# Patient Record
Sex: Male | Born: 1980 | Race: White | Hispanic: No | Marital: Single | Smoking: Never smoker
Health system: Southern US, Community
[De-identification: ages and names within clinical notes are randomized; demographics above are authoritative.]

## PROBLEM LIST (undated history)

## (undated) HISTORY — PX: FRACTURE SURGERY: SHX138

## (undated) HISTORY — PX: ANKLE SURGERY: SHX546

---
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
## 2003-05-04 ENCOUNTER — Emergency Department (HOSPITAL_COMMUNITY): Admission: EM | Admit: 2003-05-04 | Discharge: 2003-05-04 | Payer: Self-pay | Admitting: *Deleted

## 2003-08-15 ENCOUNTER — Emergency Department (HOSPITAL_COMMUNITY): Admission: AD | Admit: 2003-08-15 | Discharge: 2003-08-15 | Payer: Self-pay | Admitting: Family Medicine

## 2003-08-17 ENCOUNTER — Emergency Department (HOSPITAL_COMMUNITY): Admission: EM | Admit: 2003-08-17 | Discharge: 2003-08-18 | Payer: Self-pay | Admitting: Emergency Medicine

## 2004-07-21 ENCOUNTER — Emergency Department (HOSPITAL_COMMUNITY): Admission: EM | Admit: 2004-07-21 | Discharge: 2004-07-21 | Payer: Self-pay | Admitting: Family Medicine

## 2004-08-01 ENCOUNTER — Emergency Department (HOSPITAL_COMMUNITY): Admission: EM | Admit: 2004-08-01 | Discharge: 2004-08-01 | Payer: Self-pay | Admitting: Family Medicine

## 2004-08-03 ENCOUNTER — Emergency Department (HOSPITAL_COMMUNITY): Admission: EM | Admit: 2004-08-03 | Discharge: 2004-08-03 | Payer: Self-pay | Admitting: Family Medicine

## 2005-01-13 ENCOUNTER — Emergency Department (HOSPITAL_COMMUNITY): Admission: EM | Admit: 2005-01-13 | Discharge: 2005-01-13 | Payer: Self-pay | Admitting: Emergency Medicine

## 2005-02-01 ENCOUNTER — Emergency Department (HOSPITAL_COMMUNITY): Admission: EM | Admit: 2005-02-01 | Discharge: 2005-02-01 | Payer: Self-pay | Admitting: Emergency Medicine

## 2005-06-27 ENCOUNTER — Emergency Department (HOSPITAL_COMMUNITY): Admission: EM | Admit: 2005-06-27 | Discharge: 2005-06-27 | Payer: Self-pay | Admitting: Family Medicine

## 2005-10-10 ENCOUNTER — Ambulatory Visit: Payer: Self-pay | Admitting: Family Medicine

## 2005-11-14 ENCOUNTER — Ambulatory Visit: Payer: Self-pay | Admitting: Family Medicine

## 2006-05-24 ENCOUNTER — Emergency Department (HOSPITAL_COMMUNITY): Admission: EM | Admit: 2006-05-24 | Discharge: 2006-05-24 | Payer: Self-pay | Admitting: Family Medicine

## 2006-09-21 ENCOUNTER — Emergency Department (HOSPITAL_COMMUNITY): Admission: EM | Admit: 2006-09-21 | Discharge: 2006-09-21 | Payer: Self-pay | Admitting: Family Medicine

## 2007-11-05 ENCOUNTER — Ambulatory Visit: Payer: Self-pay | Admitting: Family Medicine

## 2007-11-05 ENCOUNTER — Encounter (INDEPENDENT_AMBULATORY_CARE_PROVIDER_SITE_OTHER): Payer: Self-pay | Admitting: Family Medicine

## 2007-11-05 DIAGNOSIS — R5381 Other malaise: Secondary | ICD-10-CM

## 2007-11-05 DIAGNOSIS — N509 Disorder of male genital organs, unspecified: Secondary | ICD-10-CM | POA: Insufficient documentation

## 2007-11-05 DIAGNOSIS — F411 Generalized anxiety disorder: Secondary | ICD-10-CM | POA: Insufficient documentation

## 2007-11-05 DIAGNOSIS — R5383 Other fatigue: Secondary | ICD-10-CM

## 2007-11-05 LAB — CONVERTED CEMR LAB
Chlamydia, Swab/Urine, PCR: NEGATIVE
GC Probe Amp, Urine: NEGATIVE
HCT: 50.2 % (ref 39.0–52.0)
Hemoglobin: 17.4 g/dL — ABNORMAL HIGH (ref 13.0–17.0)
MCHC: 34.7 g/dL (ref 30.0–36.0)
MCV: 87.5 fL (ref 78.0–100.0)
Platelets: 251 10*3/uL (ref 150–400)
RBC: 5.74 M/uL (ref 4.22–5.81)
RDW: 12.6 % (ref 11.5–15.5)
TSH: 1.15 microintl units/mL (ref 0.350–5.50)
WBC: 6.8 10*3/uL (ref 4.0–10.5)

## 2007-12-09 ENCOUNTER — Ambulatory Visit: Payer: Self-pay | Admitting: Family Medicine

## 2007-12-09 DIAGNOSIS — J321 Chronic frontal sinusitis: Secondary | ICD-10-CM

## 2007-12-24 ENCOUNTER — Ambulatory Visit: Payer: Self-pay | Admitting: Family Medicine

## 2007-12-24 DIAGNOSIS — J309 Allergic rhinitis, unspecified: Secondary | ICD-10-CM | POA: Insufficient documentation

## 2008-06-19 ENCOUNTER — Emergency Department (HOSPITAL_COMMUNITY): Admission: EM | Admit: 2008-06-19 | Discharge: 2008-06-19 | Payer: Self-pay | Admitting: Emergency Medicine

## 2009-06-08 ENCOUNTER — Emergency Department (HOSPITAL_COMMUNITY): Admission: EM | Admit: 2009-06-08 | Discharge: 2009-06-08 | Payer: Self-pay | Admitting: Family Medicine

## 2014-07-17 ENCOUNTER — Emergency Department (HOSPITAL_COMMUNITY): Payer: BC Managed Care – PPO

## 2014-07-17 ENCOUNTER — Encounter (HOSPITAL_COMMUNITY): Payer: Self-pay | Admitting: Emergency Medicine

## 2014-07-17 ENCOUNTER — Emergency Department (HOSPITAL_COMMUNITY)
Admission: EM | Admit: 2014-07-17 | Discharge: 2014-07-18 | Disposition: A | Discharge status: Home / Self Care | Payer: BC Managed Care – PPO | Attending: Emergency Medicine | Admitting: Emergency Medicine

## 2014-07-17 DIAGNOSIS — Y92410 Unspecified street and highway as the place of occurrence of the external cause: Secondary | ICD-10-CM | POA: Insufficient documentation

## 2014-07-17 DIAGNOSIS — S0184XA Puncture wound with foreign body of other part of head, initial encounter: Secondary | ICD-10-CM | POA: Diagnosis not present

## 2014-07-17 DIAGNOSIS — Z23 Encounter for immunization: Secondary | ICD-10-CM | POA: Diagnosis not present

## 2014-07-17 DIAGNOSIS — S0081XA Abrasion of other part of head, initial encounter: Secondary | ICD-10-CM

## 2014-07-17 DIAGNOSIS — M795 Residual foreign body in soft tissue: Secondary | ICD-10-CM

## 2014-07-17 DIAGNOSIS — S0990XA Unspecified injury of head, initial encounter: Secondary | ICD-10-CM | POA: Diagnosis present

## 2014-07-17 DIAGNOSIS — Y9389 Activity, other specified: Secondary | ICD-10-CM | POA: Diagnosis not present

## 2014-07-17 MED ORDER — TETANUS-DIPHTH-ACELL PERTUSSIS 5-2.5-18.5 LF-MCG/0.5 IM SUSP
0.5000 mL | Freq: Once | INTRAMUSCULAR | Status: AC
  Administered 2014-07-17: 0.5 mL via INTRAMUSCULAR
  Filled 2014-07-17: qty 0.5

## 2014-07-17 MED ORDER — ACETAMINOPHEN 500 MG PO TABS
1000.0000 mg | ORAL_TABLET | Freq: Once | ORAL | Status: AC
  Administered 2014-07-17: 1000 mg via ORAL
  Filled 2014-07-17: qty 2
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
## 2014-07-18 NOTE — ED Notes (Signed)
MD at bedside. 

## 2015-01-20 ENCOUNTER — Emergency Department (HOSPITAL_COMMUNITY): Payer: BLUE CROSS/BLUE SHIELD

## 2015-01-20 ENCOUNTER — Encounter (HOSPITAL_COMMUNITY): Payer: Self-pay | Admitting: *Deleted

## 2015-01-20 ENCOUNTER — Emergency Department (HOSPITAL_COMMUNITY)
Admission: EM | Admit: 2015-01-20 | Discharge: 2015-01-21 | Disposition: A | Discharge status: Home / Self Care | Payer: BLUE CROSS/BLUE SHIELD | Attending: Emergency Medicine | Admitting: Emergency Medicine

## 2015-01-20 DIAGNOSIS — Y998 Other external cause status: Secondary | ICD-10-CM | POA: Insufficient documentation

## 2015-01-20 DIAGNOSIS — Y9289 Other specified places as the place of occurrence of the external cause: Secondary | ICD-10-CM | POA: Diagnosis not present

## 2015-01-20 DIAGNOSIS — S6991XA Unspecified injury of right wrist, hand and finger(s), initial encounter: Secondary | ICD-10-CM | POA: Diagnosis present

## 2015-01-20 DIAGNOSIS — S63259A Unspecified dislocation of unspecified finger, initial encounter: Secondary | ICD-10-CM

## 2015-01-20 DIAGNOSIS — S63294A Dislocation of distal interphalangeal joint of right ring finger, initial encounter: Secondary | ICD-10-CM | POA: Insufficient documentation

## 2015-01-20 DIAGNOSIS — W2107XA Struck by softball, initial encounter: Secondary | ICD-10-CM | POA: Insufficient documentation

## 2015-01-20 DIAGNOSIS — Y9364 Activity, baseball: Secondary | ICD-10-CM | POA: Diagnosis not present

## 2015-01-20 MED ORDER — LIDOCAINE HCL (PF) 1 % IJ SOLN
5.0000 mL | Freq: Once | INTRAMUSCULAR | Status: AC
  Administered 2015-01-20: 5 mL via INTRADERMAL
  Filled 2015-01-20: qty 5

## 2015-01-20 NOTE — ED Provider Notes (Signed)
CSN: [id number]     Arrival date & time 01/20/15  2301 History  This chart was scribed for non-physician provider Harle BattiestElizabeth Antwan Pandya, NP-C, working with Marisa Severinlga Otter, MD by Phillis HaggisGabriella Gaje, ED Scribe. This patient was seen in room WTR8/WTR8 and patient care was started at 11:30 PM.     Chief Complaint  Patient presents with  . Finger Injury   The history is provided by the patient. No language interpreter was used.  HPI Comments: Sergio Humphrey is a 34 y.o. male who presents to the Emergency Department complaining of right ring finger injury onset PTA. He states that he was playing softball when his ring finger was hit with the ball. He states that he is not in intense pain right now, rates it 3/10. Patient reports taking ibuprofen PTA.    History reviewed. No pertinent past medical history. Past Surgical History  Procedure Laterality Date  . Fracture surgery      bilateral arms   No family history on file. History  Substance Use Topics  . Smoking status: Never Smoker   . Smokeless tobacco: Not on file  . Alcohol Use: Yes     Comment: occ    Review of Systems  Musculoskeletal: Positive for joint swelling and arthralgias.  Neurological: Negative for weakness and numbness.   Allergies  Codeine  Home Medications   Prior to Admission medications   Not on File   BP 115/68 mmHg  Pulse 103  Temp(Src) 98.9 F (37.2 C) (Oral)  Resp 16  SpO2 100%  Physical Exam  Constitutional: He is oriented to person, place, and time. He appears well-developed and well-nourished. No distress.  HENT:  Head: Normocephalic and atraumatic.  Eyes: Conjunctivae and EOM are normal.  Neck: Normal range of motion. Neck supple.  Cardiovascular: Normal rate, regular rhythm and normal heart sounds.   Pulmonary/Chest: Effort normal and breath sounds normal.  Musculoskeletal: Normal range of motion. He exhibits no edema.  5/5 strength and flexion in all fingers except 4th finger. Deformity noted to  the 4th finger over DIP joint. Mild ecchymosis to the second phalanx.    Neurological: He is alert and oriented to person, place, and time.  Skin: Skin is warm and dry.  Psychiatric: He has a normal mood and affect. His behavior is normal.  Nursing note and vitals reviewed.   ED Course  Procedures (including critical care time) NERVE BLOCK Performed by: Harle Battiestysinger, [place] Hill[place] Consent: Verbal consent obtained. Required items: required blood products, implants, devices, and special equipment available Time out: Immediately prior to procedure a "time out" was called to verify the correct patient, procedure, equipment, support staff and site/side marked as required.  Indication: finger dislocation Nerve block body site: right fourth finger digital block  Preparation: Patient was prepped and draped in the usual sterile fashion. Needle gauge: 27 G Location technique: anatomical landmarks  Local anesthetic: lidocaine without epi  Anesthetic total: 4 ml  Outcome: pain improved Patient tolerance: Patient tolerated the procedure well with no immediate complications.   DIAGNOSTIC STUDIES: Oxygen Saturation is 100% on room air, normal by my interpretation.    COORDINATION OF CARE: 11:32 PM-Discussed treatment plan which includes x-ray with pt at bedside and pt agreed to plan.   Labs Review Labs Reviewed - No data to display  Imaging Review Dg Finger Ring Right  01/20/2015   CLINICAL DATA:  Softball injury.  EXAM: RIGHT RING FINGER 2+V  COMPARISON:  None.  FINDINGS: There is dorsal dislocation about the fourth  DIP. No fracture is evident. There is no radiopaque foreign body.  IMPRESSION: Dorsal dislocation of the fourth DIP.   Electronically Signed   By: Ellery Plunkaniel R Mitchell M.D.   On: 01/20/2015 23:38     EKG Interpretation None      MDM   Final diagnoses:  Finger dislocation, initial encounter   34 yo with dislocation of right ring finger dislocation from softball injury, no  fracture noted on x-ray.  Digital block done and finger reduced without difficulty. Splint provided. Finger remain n/v intact throughout. Pt is well-appearing, in no acute distress and vital signs reviewed and not concerning. He appears safe to be discharged.  Discharge include follow-up with hand surgery.  Return precautions provided.  Pt aware of plan and in agreement.  Filed Vitals:   01/20/15 2316  BP: 115/68  Pulse: 103  Temp: 98.9 F (37.2 C)  TempSrc: Oral  Resp: 16  SpO2: 100%   Meds given in ED:  Medications  lidocaine (PF) (XYLOCAINE) 1 % injection 5 mL (5 mLs Intradermal Given by Other 01/20/15 2357)    Discharge Medication List as of 01/21/2015 12:03 AM    START taking these medications   Details  HYDROcodone-acetaminophen (NORCO/VICODIN) 5-325 MG per tablet Take 1 tablet by mouth every 4 (four) hours as needed., Starting 01/21/2015, Until Discontinued, Print    naproxen (NAPROSYN) 500 MG tablet Take 1 tablet (500 mg total) by mouth 2 (two) times daily., Starting 01/21/2015, Until Discontinued, Print          Harle BattiestElizabeth Chea Malan, NP 01/22/15 [id number]  Marisa Severinlga Otter, MD 01/22/15 [phone]

## 2015-01-20 NOTE — ED Notes (Signed)
See triage note.

## 2015-01-20 NOTE — ED Notes (Signed)
Pt reports getting hit in his R ring finger while playing softball today.  No obvious deformity noted at this time.

## 2015-01-21 MED ORDER — HYDROCODONE-ACETAMINOPHEN 5-325 MG PO TABS: 1.0000 | ORAL_TABLET | ORAL | Status: DC | PRN

## 2015-01-21 MED ORDER — NAPROXEN 500 MG PO TABS: 500.0000 mg | ORAL_TABLET | Freq: Two times a day (BID) | ORAL | Status: DC

## 2015-01-21 NOTE — Discharge Instructions (Signed)
Please follow the directions provided. Be sure to follow up with the hand doctor. Wear your splint for comfort. Take naproxen twice a day to help with inflammation. You may take Vicodin for pain not relieved by the naproxen. Don't hesitate to return for any new, worsening, or concerning symptoms.   SEEK IMMEDIATE MEDICAL CARE IF:  Your dressing or splint becomes damaged.  Your pain becomes worse rather than better.  You lose feeling in your finger, or it becomes cold and white.
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
## 2016-08-11 ENCOUNTER — Encounter (HOSPITAL_COMMUNITY): Payer: Self-pay | Admitting: Emergency Medicine

## 2016-08-11 ENCOUNTER — Ambulatory Visit (HOSPITAL_COMMUNITY)
Admission: EM | Admit: 2016-08-11 | Discharge: 2016-08-11 | Disposition: A | Discharge status: Home / Self Care | Payer: BLUE CROSS/BLUE SHIELD | Attending: Emergency Medicine | Admitting: Emergency Medicine

## 2016-08-11 DIAGNOSIS — T700XXA Otitic barotrauma, initial encounter: Secondary | ICD-10-CM | POA: Diagnosis not present

## 2016-08-11 DIAGNOSIS — H6983 Other specified disorders of Eustachian tube, bilateral: Secondary | ICD-10-CM | POA: Diagnosis not present

## 2016-08-11 DIAGNOSIS — J069 Acute upper respiratory infection, unspecified: Secondary | ICD-10-CM

## 2016-08-11 NOTE — ED Triage Notes (Signed)
Here for cold sx onset 1 week onset associated w/nasal congestion/drainage, prod cough and bilateral ear pain  Denies fever, chills  A&O x4... NAD

## 2016-08-11 NOTE — ED Provider Notes (Signed)
CSN: [id number]     Arrival date & time 08/11/16  1823 History   First MD Initiated Contact with Patient 08/11/16 1834     Chief Complaint  Patient presents with  . URI   (Consider location/radiation/quality/duration/timing/severity/associated sxs/prior Treatment) 35 year old male complaining of upper respiratory congestion, nasal congestion, some sinus pressure, pressure in the ears and runny nose for about a week. He has taken Mucinex without substantial relief. Denies fevers or chills.      History reviewed. No pertinent past medical history. Past Surgical History:  Procedure Laterality Date  . FRACTURE SURGERY     bilateral arms   No family history on file. Social History  Substance Use Topics  . Smoking status: Never Smoker  . Smokeless tobacco: Never Used  . Alcohol use Yes     Comment: occ    Review of Systems  Constitutional: Positive for activity change and fever. Negative for diaphoresis and fatigue.  HENT: Positive for congestion, postnasal drip and rhinorrhea. Negative for ear pain, facial swelling, sore throat and trouble swallowing.   Eyes: Negative for pain, discharge and redness.  Respiratory: Negative for cough, chest tightness and shortness of breath.   Cardiovascular: Negative.   Gastrointestinal: Negative.   Musculoskeletal: Negative.  Negative for neck pain and neck stiffness.  Neurological: Negative.     Allergies  Codeine  Home Medications   Prior to Admission medications   Medication Sig Start Date End Date Taking? Authorizing Provider  HYDROcodone-acetaminophen (NORCO/VICODIN) 5-325 MG per tablet Take 1 tablet by mouth every 4 (four) hours as needed. 01/21/15   Harle BattiestElizabeth Tysinger, NP  naproxen (NAPROSYN) 500 MG tablet Take 1 tablet (500 mg total) by mouth 2 (two) times daily. 01/21/15   Harle BattiestElizabeth Tysinger, NP   Meds Ordered and Administered this Visit  Medications - No data to display  BP 119/83 (BP Location: Right Arm)   Pulse 78   Temp  98.3 F (36.8 C) (Oral)   Resp 18   SpO2 96%  No data found.   Physical Exam  Constitutional: He is oriented to person, place, and time. He appears well-developed and well-nourished. No distress.  HENT:  Head: Normocephalic and atraumatic.  Right Ear: External ear normal.  Bilateral TMs with minor retraction otherwise normal. Oropharynx with minor cobblestoning, scant clear PND. No exudates or swelling.  Eyes: EOM are normal.  Neck: Normal range of motion. Neck supple.  Cardiovascular: Normal rate, regular rhythm and normal heart sounds.   Pulmonary/Chest: Effort normal and breath sounds normal.  Musculoskeletal: Normal range of motion.  Lymphadenopathy:    He has no cervical adenopathy.  Neurological: He is alert and oriented to person, place, and time.  Skin: Skin is warm and dry.  Nursing note and vitals reviewed.   Urgent Care Course   Clinical Course     Procedures (including critical care time)  Labs Review Labs Reviewed - No data to display  Imaging Review No results found.   Visual Acuity Review  Right Eye Distance:   Left Eye Distance:   Bilateral Distance:    Right Eye Near:   Left Eye Near:    Bilateral Near:         MDM   1. Acute upper respiratory infection   2. ETD (Eustachian tube dysfunction), bilateral   3. Barotitis media, initial encounter   Sudafed PE 10 mg every 4 to 6 hours as needed for congestion Allegra or Zyrtec daily as needed for drainage and runny nose. For stronger antihistamine  may take Chlor-Trimeton 2 to 4 mg every 4 to 6 hours, may cause drowsiness. Saline nasal spray used frequently. Ibuprofen 600 mg every 6 hours as needed for pain, discomfort or fever. Drink plenty of fluids and stay well-hydrated. Flonase or Rhinocort nasal spray daily      Hayden Rasmussenavid Gemini Bunte, NP 08/11/16 1906

## 2016-08-11 NOTE — Discharge Instructions (Signed)
Sudafed PE 10 mg every 4 to 6 hours as needed for congestion °Allegra or Zyrtec daily as needed for drainage and runny nose. °For stronger antihistamine may take Chlor-Trimeton 2 to 4 mg every 4 to 6 hours, may cause drowsiness. °Saline nasal spray used frequently. °Ibuprofen 600 mg every 6 hours as needed for pain, discomfort or fever. °Drink plenty of fluids and stay well-hydrated. °Flonase or Rhinocort nasal spray daily °

## 2016-08-15 ENCOUNTER — Encounter (HOSPITAL_COMMUNITY): Payer: Self-pay | Admitting: Emergency Medicine

## 2016-08-15 ENCOUNTER — Ambulatory Visit (HOSPITAL_COMMUNITY)
Admission: EM | Admit: 2016-08-15 | Discharge: 2016-08-15 | Disposition: A | Discharge status: Home / Self Care | Payer: BLUE CROSS/BLUE SHIELD | Attending: Emergency Medicine | Admitting: Emergency Medicine

## 2016-08-15 DIAGNOSIS — J0110 Acute frontal sinusitis, unspecified: Secondary | ICD-10-CM | POA: Diagnosis not present

## 2016-08-15 MED ORDER — AMOXICILLIN-POT CLAVULANATE 875-125 MG PO TABS: 1.0000 | ORAL_TABLET | Freq: Two times a day (BID) | ORAL | 0 refills | Status: DC

## 2016-08-15 NOTE — ED Provider Notes (Signed)
[facility]    CSN: [id number] Arrival date & [id number]: 08/15/16  1643     History   Chief Complaint Chief Complaint  Patient presents with  . Facial Pain    HPI Sergio Humphrey is a 35 y.o. male.   HPI  He is a 35 year old man here for evaluation of sinus pressure. He states he started with a cold about 2 weeks ago. He was seen here for 5 days ago and diagnosed with a cold. He was given symptomatic treatment. He states most symptoms have resolved, but he has had worsening sinus pressure and yellow nasal discharge. He does still have a cough, but this is improved. No fevers.  History reviewed. No pertinent past medical history.  Patient Active Problem List   Diagnosis Date Noted  . ALLERGIC RHINITIS 12/24/2007  . FRONTAL SINUSITIS 12/09/2007  . ANXIETY STATE, UNSPECIFIED 11/05/2007  . TESTICULAR PAIN, RIGHT 11/05/2007  . FATIGUE 11/05/2007    Past Surgical History:  Procedure Laterality Date  . FRACTURE SURGERY     bilateral arms       Home Medications    Prior to Admission medications   Medication Sig Start Date End Date Taking? Authorizing Provider  amoxicillin-clavulanate (AUGMENTIN) 875-125 MG tablet Take 1 tablet by mouth 2 (two) times daily. 08/15/16   Charm RingsErin J Breslyn Abdo, MD    Family History History reviewed. No pertinent family history.  Social History Social History  Substance Use Topics  . Smoking status: Never Smoker  . Smokeless tobacco: Never Used  . Alcohol use Yes     Comment: occ     Allergies   Codeine   Review of Systems Review of Systems As in history of present illness  Physical Exam Triage Vital Signs ED Triage Vitals  Enc Vitals Group     BP 08/15/16 1656 114/76     Pulse Rate 08/15/16 1656 72     Resp 08/15/16 1656 16     Temp 08/15/16 1656 98.1 F (36.7 C)     Temp Source 08/15/16 1656 Oral     SpO2 08/15/16 1656 98 %     Weight --      Height --      Head Circumference --      Peak Flow --      Pain  Score 08/15/16 1700 6     Pain Loc --      Pain Edu? --      Excl. in GC? --    No data found.   Updated Vital Signs BP 114/76 (BP Location: Right Arm)   Pulse 72   Temp 98.1 F (36.7 C) (Oral)   Resp 16   SpO2 98%   Visual Acuity Right Eye Distance:   Left Eye Distance:   Bilateral Distance:    Right Eye Near:   Left Eye Near:    Bilateral Near:     Physical Exam  Constitutional: He is oriented to person, place, and time. He appears well-developed and well-nourished. No distress.  HENT:  Mouth/Throat: Oropharynx is clear and moist. No oropharyngeal exudate.  Nasal mucosa is edematous. Bilateral frontal sinus tenderness. No maxillary sinus tenderness.  Neck: Neck supple.  Cardiovascular: Normal rate, regular rhythm and normal heart sounds.   No murmur heard. Pulmonary/Chest: Effort normal and breath sounds normal. No respiratory distress. He has no wheezes. He has no rales.  Lymphadenopathy:    He has no cervical adenopathy.  Neurological: He is alert and oriented to person,  place, and time.     UC Treatments / Results  Labs (all labs ordered are listed, but only abnormal results are displayed) Labs Reviewed - No data to display  EKG  EKG Interpretation None       Radiology No results found.  Procedures Procedures (including critical care time)  Medications Ordered in UC Medications - No data to display   Initial Impression / Assessment and Plan / UC Course  I have reviewed the triage vital signs and the nursing notes.  Pertinent labs & imaging results that were available during my care of the patient were reviewed by me and considered in my medical decision making (see chart for details).  Clinical Course     Treat for sinusitis with Augmentin. Recommended frequent use of nasal saline spray. Follow-up as needed.  I have verbally reviewed the discharge instructions with the patient. A printed AVS was given to the patient.  All questions were  answered prior to discharge.    Final Clinical Impressions(s) / UC Diagnoses   Final diagnoses:  Acute non-recurrent frontal sinusitis    New Prescriptions New Prescriptions   AMOXICILLIN-CLAVULANATE (AUGMENTIN) 875-125 MG TABLET    Take 1 tablet by mouth 2 (two) times daily.     Charm RingsErin J Karne Ozga, MD 08/15/16 1723

## 2016-08-15 NOTE — Discharge Instructions (Signed)
You have a sinus infection. Take Augmentin twice a day for 10 days. Use nasal saline spray as often as you can to wash out the sinuses. Follow-up as needed.

## 2016-08-15 NOTE — ED Triage Notes (Signed)
[facility] a [facility] pressure  That started 4 days ago. The patient also reported a dark yellow phlegm.

## 2016-08-25 IMAGING — CR DG FINGER RING 2+V*R*
3 series · 3 of 3 positions shown · non-contrast
Comparison: None.

CLINICAL DATA: Softball injury.

EXAM:
RIGHT RING FINGER 2+V

[x finger pa right]
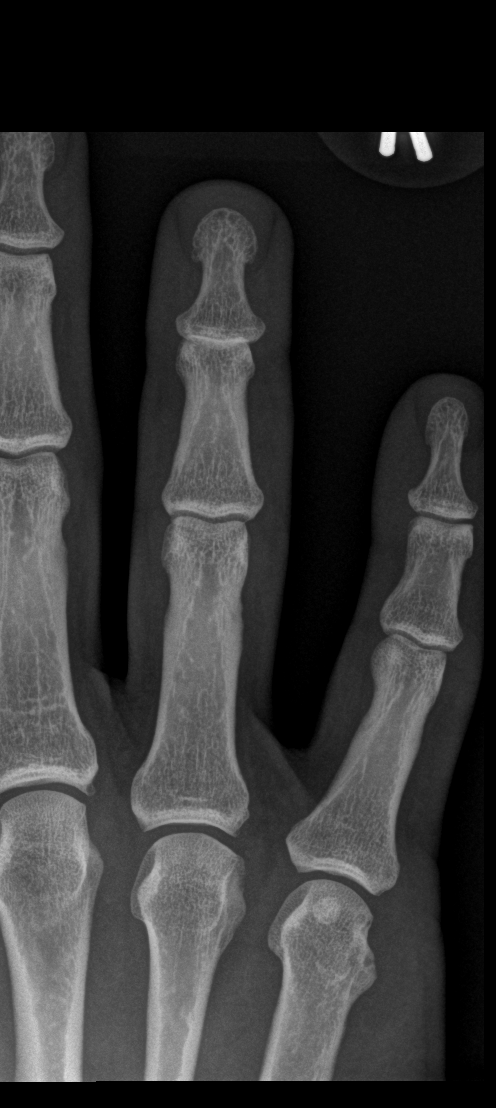

[x finger obl right]
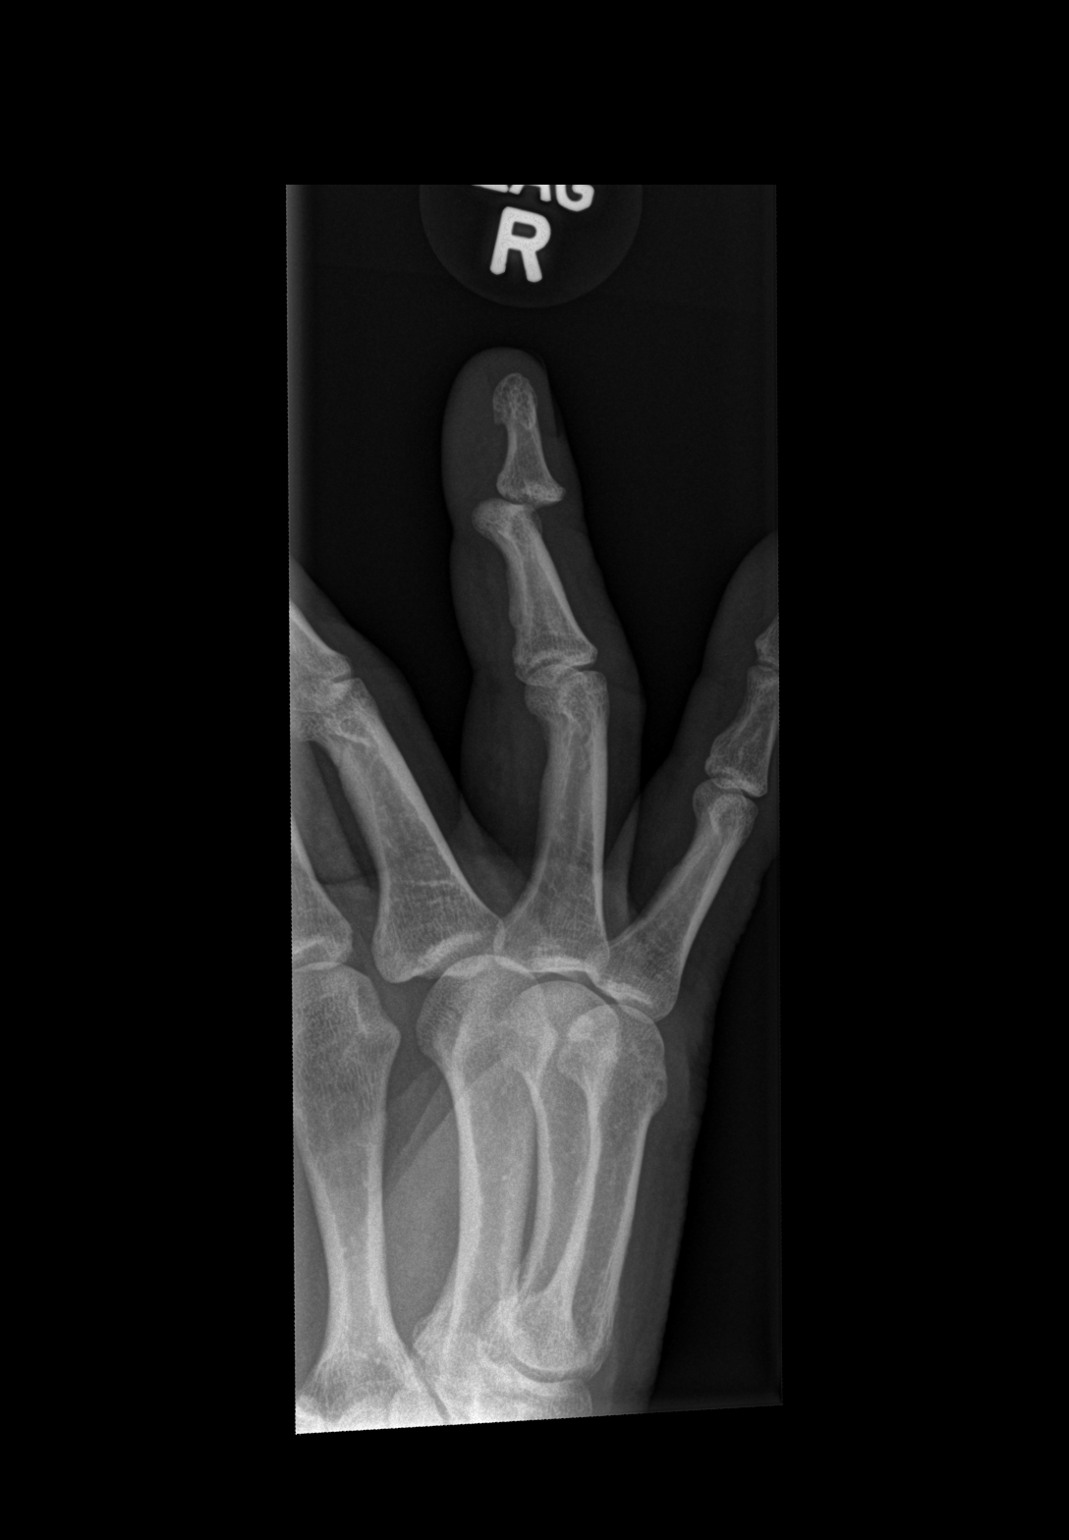

[x finger lat right]
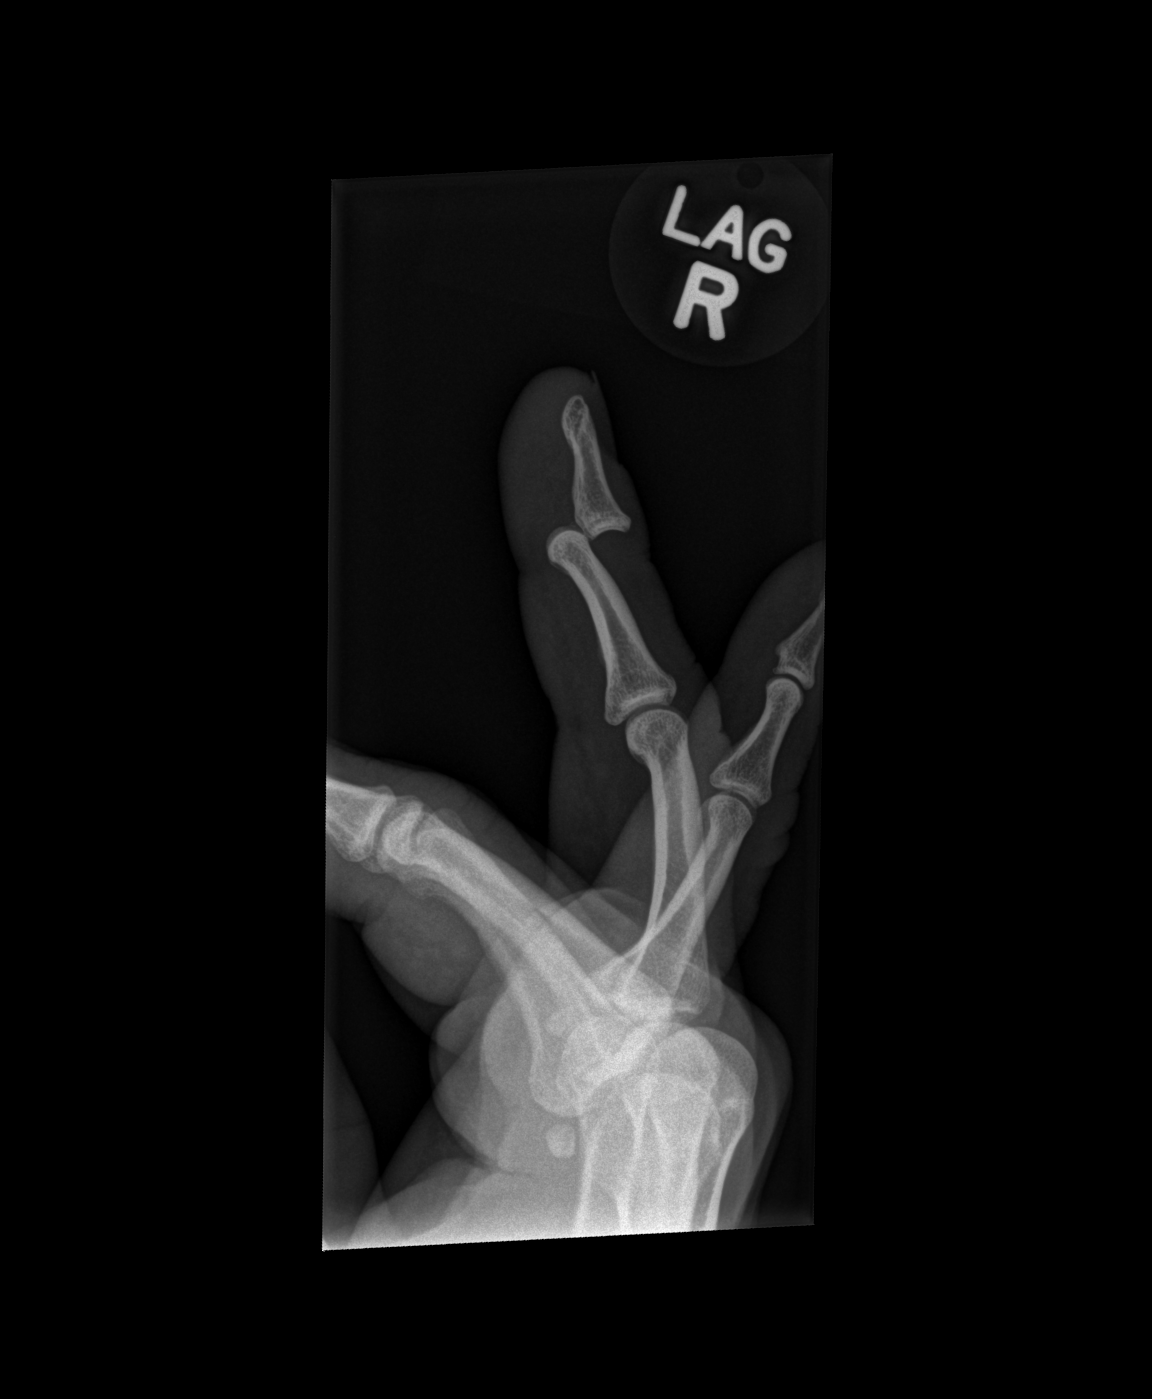

[3 of 3 positions shown; findings below may reference images not displayed]

FINDINGS: There is dorsal dislocation about the fourth DIP. No fracture is
evident. There is no radiopaque foreign body.
IMPRESSION: Dorsal dislocation of the fourth DIP.

## 2018-04-22 ENCOUNTER — Ambulatory Visit: Payer: Managed Care, Other (non HMO) | Admitting: Neurology

## 2018-04-22 ENCOUNTER — Encounter: Payer: Self-pay | Admitting: Neurology

## 2018-04-22 VITALS — BP 113/74 | HR 61 | Ht 71.0 in | Wt 179.0 lb

## 2018-04-22 DIAGNOSIS — R5381 Other malaise: Secondary | ICD-10-CM | POA: Diagnosis not present

## 2018-04-22 DIAGNOSIS — R5383 Other fatigue: Secondary | ICD-10-CM

## 2018-04-22 NOTE — Progress Notes (Signed)
Reason for visit: Malaise, head pressure  Referring physician: Dr. Gerarda FractionNewman  Sergio Humphrey is a 37 y.o. male  History of present illness:  Sergio Humphrey is a 37 year old right-handed white male with a 5441-month history of malaise.  The patient has started getting some pressure sensations in the temple area and in the ears, he may feel hot or cold, he is not actually running fevers.  He may have night sweats, he has had some mild weight loss over the last several weeks of up to 5 pounds.  The patient has some light sensitivity, he reports no nausea or vomiting, he has slight decrease in appetite.  He denies any numbness or focal weakness of the extremities.  He denies issues with balance or difficulty controlling the bowels or bladder.  He may feel dizzy on occasion.  The patient has just started Allegra, he was on a 6-day course of prednisone without benefit.  He denies any skin rashes or significant problems with joint pain.  He is concerned that he just does not feel well and it is not getting better.  He denies any tick exposure but he does work outside, he works for [employer], he does have exposure to some chemicals.  The patient had an ENT evaluation, he is to start Allegra for possible allergies in the near future.  He will be seeing his doctor tomorrow for blood work.  He comes here for further evaluation.  No past medical history on file.  Past Surgical History:  Procedure Laterality Date  . ANKLE SURGERY    . FRACTURE SURGERY     bilateral arms    Family History  Problem Relation Age of Onset  . Cancer Mother        breast    Social history:  reports that he has never smoked. He has never used smokeless tobacco. He reports that he drinks alcohol. He reports that he does not use drugs.  Medications:  Prior to Admission medications   Medication Sig Start Date End Date Taking? Authorizing Provider  amoxicillin-clavulanate (AUGMENTIN) 875-125 MG tablet Take 1 tablet by  mouth 2 (two) times daily. 04/22/18 05/02/18 Yes [provider]  fexofenadine (ALLEGRA) 180 MG tablet Take 1 tablet by mouth daily. 04/22/18  Yes [provider]  fluticasone (FLONASE) 50 MCG/ACT nasal spray Place 1 spray into both nostrils as needed. 04/09/18 04/09/19 Yes [provider]      Allergies  Allergen Reactions  . Codeine     ROS:  Out of a complete 14 system review of symptoms, the patient complains only of the following symptoms, and all other reviewed systems are negative.  Weight loss, fatigue Feeling hot, cold Ringing in the ears Headache Not enough sleep, decreased energy, change in appetite  Blood pressure 113/74, pulse 61, height 5\' 11"  (1.803 m), weight 179 lb (81.2 kg).  Physical Exam  General: The patient is alert and cooperative at the time of the examination.  Eyes: Pupils are equal, round, and reactive to light. Discs are flat bilaterally.  Neck: The neck is supple, no carotid bruits are noted.  Respiratory: The respiratory examination is clear.  Cardiovascular: The cardiovascular examination reveals a regular rate and rhythm, no obvious murmurs or rubs are noted.  Skin: Extremities are without significant edema.  Neurologic Exam  Mental status: The patient is alert and oriented x 3 at the time of the examination. The patient has apparent normal recent and remote memory, with an apparently normal  attention span and concentration ability.  Cranial nerves: Facial symmetry is present. There is good sensation of the face to pinprick and soft touch bilaterally. The strength of the facial muscles and the muscles to head turning and shoulder shrug are normal bilaterally. Speech is well enunciated, no aphasia or dysarthria is noted. Extraocular movements are full. Visual fields are full. The tongue is midline, and the patient has symmetric elevation of the soft palate. No obvious hearing deficits are noted.  Motor: The motor testing  reveals 5 over 5 strength of all 4 extremities. Good symmetric motor tone is noted throughout.  Sensory: Sensory testing is intact to pinprick, soft touch, vibration sensation, and position sense on all 4 extremities. No evidence of extinction is noted.  Coordination: Cerebellar testing reveals good finger-nose-finger and heel-to-shin bilaterally.  Gait and station: Gait is normal. Tandem gait is normal. Romberg is negative. No drift is seen.  Reflexes: Deep tendon reflexes are symmetric and normal bilaterally. Toes are downgoing bilaterally.   Assessment/Plan:  1.  Malaise  2.  Head pressure  The patient has a nonspecific syndrome of malaise and some pressure in the head.  The patient does not have a history consistent with migraine, first evaluation should include blood work, we will check blood work today.  He will be getting some routine blood work done through his primary care physician tomorrow.  If the course of Allegra does not help within the next 2 or 3 weeks, we may go on to get a CT of the head to evaluate the sinuses.  The patient will follow-up in 3 months.  Marlan Palau MD 04/22/2018 3:35 PM  [facility] [address] [place], [place] [address]  Phone [phone] Fax [phone]

## 2018-04-22 NOTE — Patient Instructions (Signed)
   We will check blood work today, call in 2 to 3 weeks if you are not feeling better on the Allegra.

## 2018-04-23 LAB — B. BURGDORFI ANTIBODIES

## 2018-04-23 LAB — RHEUMATOID FACTOR

## 2018-04-23 LAB — SEDIMENTATION RATE: SED RATE: 2 mm/h (ref 0–15)

## 2018-04-23 LAB — HIV ANTIBODY (ROUTINE TESTING W REFLEX): HIV Screen 4th Generation wRfx: NONREACTIVE

## 2018-04-23 LAB — ANA W/REFLEX: ANA: NEGATIVE

## 2018-04-24 ENCOUNTER — Telehealth: Payer: Self-pay | Admitting: *Deleted

## 2018-04-24 NOTE — Telephone Encounter (Signed)
Called and spoke with patient about unremarkable labs per CW,MD note. Pt verbalized understanding.  

## 2018-04-24 NOTE — Telephone Encounter (Signed)
-----   Message from York Spanielharles K Willis, MD sent at 04/23/2018  5:07 PM EDT -----  The blood work results are unremarkable. Please call the patient.  ----- Message ----- From: Nell RangeInterface, [facility] Lab Results In Sent: 04/23/2018   7:39 AM To: York Spanielharles K Willis, MD

## 2018-07-23 ENCOUNTER — Ambulatory Visit: Payer: Managed Care, Other (non HMO) | Admitting: Neurology

## 2021-05-30 ENCOUNTER — Ambulatory Visit (INDEPENDENT_AMBULATORY_CARE_PROVIDER_SITE_OTHER): Payer: BLUE CROSS/BLUE SHIELD | Admitting: Otolaryngology

## 2023-07-18 ENCOUNTER — Encounter: Payer: Self-pay | Admitting: Emergency Medicine

## 2023-07-18 ENCOUNTER — Ambulatory Visit
Admission: EM | Admit: 2023-07-18 | Discharge: 2023-07-18 | Disposition: A | Discharge status: Home / Self Care | Payer: PRIVATE HEALTH INSURANCE | Attending: Family Medicine | Admitting: Family Medicine

## 2023-07-18 ENCOUNTER — Ambulatory Visit: Payer: PRIVATE HEALTH INSURANCE

## 2023-07-18 DIAGNOSIS — J209 Acute bronchitis, unspecified: Secondary | ICD-10-CM | POA: Diagnosis not present

## 2023-07-18 DIAGNOSIS — R0981 Nasal congestion: Secondary | ICD-10-CM

## 2023-07-18 DIAGNOSIS — J22 Unspecified acute lower respiratory infection: Secondary | ICD-10-CM | POA: Diagnosis not present

## 2023-07-18 DIAGNOSIS — R059 Cough, unspecified: Secondary | ICD-10-CM

## 2023-07-18 MED ORDER — AMOXICILLIN-POT CLAVULANATE 875-125 MG PO TABS: 1.0000 | ORAL_TABLET | Freq: Two times a day (BID) | ORAL | 0 refills | Status: AC

## 2023-07-18 MED ORDER — PREDNISONE 20 MG PO TABS: 40.0000 mg | ORAL_TABLET | Freq: Every day | ORAL | 0 refills | Status: AC

## 2023-07-18 NOTE — ED Triage Notes (Signed)
Patient c/o cough, congestion, nasal drainage, body aches, lethargic, headaches x 1 week.  Patient is concerned for pneumonia.  His kids were recently diagnosed with pneumonia.  Patient has taken Mucinex, Delsym and Ibuprofen.

## 2023-07-18 NOTE — Discharge Instructions (Addendum)
Drink lots of fluids Take antibiotic as directed 2 times a day Take prednisone daily.  This will help with the coughing spells and congestion Continue Mucinex DM or similar for cough See your doctor if not improving by next week

## 2023-07-18 NOTE — ED Provider Notes (Signed)
[facility]    CSN: [id number] Arrival date & time: 07/18/23  1358      History   Chief Complaint Chief Complaint  Patient presents with   Cough    HPI [name] is a 42 y.o. male.   Patient states that his 2 children both have had coughs and infection for over a week.  They went to the pediatrician yesterday and they both were found to have pneumonia.  Patient states that he has also had a cough that is harsh, coughing spells, fatigue for almost 2 weeks.  He states that he has pain in his right chest with deep breath and with cough.  Minimal sputum production.  No shortness of breath or wheezing.    History reviewed. No pertinent past medical history.  Patient Active Problem List   Diagnosis Date Noted   ALLERGIC RHINITIS 12/24/2007   FRONTAL SINUSITIS 12/09/2007   ANXIETY STATE, UNSPECIFIED 11/05/2007   TESTICULAR PAIN, RIGHT 11/05/2007   FATIGUE 11/05/2007    Past Surgical History:  Procedure Laterality Date   ANKLE SURGERY     FRACTURE SURGERY     bilateral arms       Home Medications    Prior to Admission medications   Medication Sig Start Date End Date Taking? Authorizing Provider  amoxicillin-clavulanate (AUGMENTIN) 875-125 MG tablet Take 1 tablet by mouth every 12 (twelve) hours. 07/18/23  Yes Eustace Moore, MD  predniSONE (DELTASONE) 20 MG tablet Take 2 tablets (40 mg total) by mouth daily with breakfast. 07/18/23  Yes Eustace Moore, MD  fexofenadine (ALLEGRA) 180 MG tablet Take 1 tablet by mouth daily. 04/22/18   [provider]    Family History Family History  Problem Relation Age of Onset   Cancer Mother        breast    Social History Social History   Tobacco Use   Smoking status: Never   Smokeless tobacco: Never  Vaping Use   Vaping status: Former  Substance Use Topics   Alcohol use: Yes    Comment: occ   Drug use: No     Allergies   Codeine   Review of Systems Review of  Systems  See HPI Physical Exam Triage Vital Signs ED Triage Vitals  Encounter Vitals Group     BP 07/18/23 1444 128/87     Systolic BP Percentile --      Diastolic BP Percentile --      Pulse Rate 07/18/23 1444 81     Resp 07/18/23 1444 18     Temp 07/18/23 1444 98.7 F (37.1 C)     Temp Source 07/18/23 1444 Oral     SpO2 07/18/23 1444 97 %     Weight 07/18/23 1446 200 lb (90.7 kg)     Height 07/18/23 1446 5\' 11"  (1.803 m)     Head Circumference --      Peak Flow --      Pain Score 07/18/23 1445 8     Pain Loc --      Pain Education --      Exclude from Growth Chart --    No data found.  Updated Vital Signs BP 128/87 (BP Location: Right Arm)   Pulse 81   Temp 98.7 F (37.1 C) (Oral)   Resp 18   Ht 5\' 11"  (1.803 m)   Wt 90.7 kg   SpO2 97%   BMI 27.89 kg/m      Physical Exam Constitutional:  General: He is not in acute distress.    Appearance: He is well-developed and normal weight.  HENT:     Head: Normocephalic and atraumatic.     Right Ear: Tympanic membrane normal.     Left Ear: Tympanic membrane normal.     Nose: No congestion.     Mouth/Throat:     Pharynx: No posterior oropharyngeal erythema.  Eyes:     Conjunctiva/sclera: Conjunctivae normal.     Pupils: Pupils are equal, round, and reactive to light.  Cardiovascular:     Rate and Rhythm: Normal rate and regular rhythm.     Heart sounds: Normal heart sounds.  Pulmonary:     Effort: Pulmonary effort is normal. No respiratory distress.     Comments: Diminished breath sounds right base Abdominal:     General: There is no distension.     Palpations: Abdomen is soft.  Musculoskeletal:        General: Normal range of motion.     Cervical back: Normal range of motion.  Skin:    General: Skin is warm and dry.  Neurological:     Mental Status: He is alert.      UC Treatments / Results  Labs (all labs ordered are listed, but only abnormal results are displayed) Labs Reviewed - No data to  display  EKG   Radiology No results found.  Procedures Procedures (including critical care time)  Medications Ordered in UC Medications - No data to display  Initial Impression / Assessment and Plan / UC Course  I have reviewed the triage vital signs and the nursing notes.  Pertinent labs & imaging results that were available during my care of the patient were reviewed by me and considered in my medical decision making (see chart for details).     Patient is discharged prior to x-ray reading.  X-ray reading is delayed today.  To my review it is a normal chest x-ray.  Patient will be called if any findings suggest otherwise Final Clinical Impressions(s) / UC Diagnoses   Final diagnoses:  LRTI (lower respiratory tract infection)  Acute bronchitis, unspecified organism     Discharge Instructions      Drink lots of fluids Take antibiotic as directed 2 times a day Take prednisone daily.  This will help with the coughing spells and congestion Continue Mucinex DM or similar for cough See your doctor if not improving by next week     ED Prescriptions     Medication Sig Dispense Auth. Provider   amoxicillin-clavulanate (AUGMENTIN) 875-125 MG tablet Take 1 tablet by mouth every 12 (twelve) hours. 14 tablet Eustace Moore, MD   predniSONE (DELTASONE) 20 MG tablet Take 2 tablets (40 mg total) by mouth daily with breakfast. 10 tablet Eustace Moore, MD      PDMP not reviewed this encounter.   Eustace Moore, MD 07/18/23 [phone]
